# Patient Record
Sex: Female | Born: 2005 | Race: Black or African American | Hispanic: No | Marital: Single | Smoking: Never smoker
Health system: Southern US, Community
[De-identification: ages and names within clinical notes are randomized; demographics above are authoritative.]

## PROBLEM LIST (undated history)

## (undated) DIAGNOSIS — G43909 Migraine, unspecified, not intractable, without status migrainosus: Secondary | ICD-10-CM

## (undated) HISTORY — DX: Migraine, unspecified, not intractable, without status migrainosus: G43.909

## (undated) HISTORY — PX: NO PAST SURGERIES: SHX2092

---
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
## 2024-04-15 ENCOUNTER — Telehealth: Payer: Self-pay

## 2024-04-15 NOTE — Telephone Encounter (Signed)
 Returned call, no answer, left vm. Pt previously called and left message stating that she had questions about her [facility]
